# Patient Record
Sex: Male | Born: 1970 | Race: Black or African American | Hispanic: No | Marital: Married | Smoking: Current every day smoker
Health system: Southern US, Community
[De-identification: ages and names within clinical notes are randomized; demographics above are authoritative.]

---

## 2015-12-30 ENCOUNTER — Encounter (HOSPITAL_COMMUNITY): Payer: Self-pay | Admitting: Emergency Medicine

## 2015-12-30 ENCOUNTER — Emergency Department (HOSPITAL_COMMUNITY): Payer: Self-pay

## 2015-12-30 ENCOUNTER — Emergency Department (HOSPITAL_COMMUNITY)
Admission: EM | Admit: 2015-12-30 | Discharge: 2015-12-31 | Disposition: A | Discharge status: Home / Self Care | Payer: Self-pay | Attending: Emergency Medicine | Admitting: Emergency Medicine

## 2015-12-30 DIAGNOSIS — F17200 Nicotine dependence, unspecified, uncomplicated: Secondary | ICD-10-CM | POA: Insufficient documentation

## 2015-12-30 DIAGNOSIS — R0602 Shortness of breath: Secondary | ICD-10-CM | POA: Insufficient documentation

## 2015-12-30 DIAGNOSIS — J069 Acute upper respiratory infection, unspecified: Secondary | ICD-10-CM | POA: Insufficient documentation

## 2015-12-30 LAB — CBC WITH DIFFERENTIAL/PLATELET
BASOS ABS: 0 10*3/uL (ref 0.0–0.1)
Basophils Relative: 0 %
EOS ABS: 0.1 10*3/uL (ref 0.0–0.7)
EOS PCT: 1 %
HCT: 45.7 % (ref 39.0–52.0)
Hemoglobin: 15.9 g/dL (ref 13.0–17.0)
LYMPHS PCT: 37 %
Lymphs Abs: 3.1 10*3/uL (ref 0.7–4.0)
MCH: 29.7 pg (ref 26.0–34.0)
MCHC: 34.8 g/dL (ref 30.0–36.0)
MCV: 85.4 fL (ref 78.0–100.0)
MONO ABS: 0.5 10*3/uL (ref 0.1–1.0)
Monocytes Relative: 6 %
Neutro Abs: 4.8 10*3/uL (ref 1.7–7.7)
Neutrophils Relative %: 56 %
PLATELETS: 206 10*3/uL (ref 150–400)
RBC: 5.35 MIL/uL (ref 4.22–5.81)
RDW: 12.8 % (ref 11.5–15.5)
WBC: 8.5 10*3/uL (ref 4.0–10.5)

## 2015-12-30 LAB — COMPREHENSIVE METABOLIC PANEL
ALT: 29 U/L (ref 17–63)
AST: 23 U/L (ref 15–41)
Albumin: 4.1 g/dL (ref 3.5–5.0)
Alkaline Phosphatase: 95 U/L (ref 38–126)
Anion gap: 9 (ref 5–15)
BUN: 9 mg/dL (ref 6–20)
CHLORIDE: 102 mmol/L (ref 101–111)
CO2: 26 mmol/L (ref 22–32)
Calcium: 9.2 mg/dL (ref 8.9–10.3)
Creatinine, Ser: 1.01 mg/dL (ref 0.61–1.24)
Glucose, Bld: 99 mg/dL (ref 65–99)
POTASSIUM: 3.8 mmol/L (ref 3.5–5.1)
SODIUM: 137 mmol/L (ref 135–145)
Total Bilirubin: 0.6 mg/dL (ref 0.3–1.2)
Total Protein: 6.6 g/dL (ref 6.5–8.1)

## 2015-12-30 LAB — RAPID STREP SCREEN (MED CTR MEBANE ONLY): STREPTOCOCCUS, GROUP A SCREEN (DIRECT): NEGATIVE

## 2015-12-30 MED ORDER — ACETAMINOPHEN 500 MG PO TABS
1000.0000 mg | ORAL_TABLET | Freq: Once | ORAL | Status: AC
  Administered 2015-12-30: 1000 mg via ORAL
  Filled 2015-12-30: qty 2

## 2015-12-30 MED ORDER — IBUPROFEN 800 MG PO TABS
800.0000 mg | ORAL_TABLET | Freq: Once | ORAL | Status: AC
  Administered 2015-12-30: 800 mg via ORAL
  Filled 2015-12-30: qty 1

## 2015-12-30 MED ORDER — DEXAMETHASONE 4 MG PO TABS
10.0000 mg | ORAL_TABLET | Freq: Once | ORAL | Status: AC
  Administered 2015-12-30: 10 mg via ORAL
  Filled 2015-12-30: qty 3

## 2015-12-30 NOTE — ED Triage Notes (Signed)
Pt. reports SOB onset today , denies cough or congestion , no fever or chills.

## 2015-12-30 NOTE — ED Provider Notes (Signed)
[facility]PT Provider Note   CSN: [id number] Arrival date & time: 12/30/15  2116  First Provider Contact:  First MD Initiated Contact with Patient 12/30/15 2319        History   Chief Complaint Chief Complaint  Patient presents with  . Shortness of Breath    HPI Patrick Cameron is a 45 y.o. male.  45 yo M with a chief complaint shortness of breath. Patient feels that he is really congested and has a sore throat. Denies cough fever chest pain. He denies sick contacts. Denies myalgias. No difficulty swallowing.   The history is provided by the patient.  Shortness of Breath  This is a new problem. The average episode lasts 2 days. The problem occurs continuously.The current episode started less than 1 hour ago. The problem has not changed since onset.Associated symptoms include rhinorrhea and sore throat. Pertinent negatives include no fever, no headaches, no chest pain, no vomiting, no abdominal pain and no rash. He has tried nothing for the symptoms. The treatment provided no relief. He has had no prior hospitalizations. He has had no prior ED visits. He has had no prior ICU admissions.    History reviewed. No pertinent past medical history.  There are no active problems to display for this patient.   History reviewed. No pertinent surgical history.     Home Medications    Prior to Admission medications   Not on File    Family History No family history on file.  Social History Social History  Substance Use Topics  . Smoking status: Current Every Day Smoker  . Smokeless tobacco: Not on file  . Alcohol use No     Allergies   Review of patient's allergies indicates no known allergies.   Review of Systems Review of Systems  Constitutional: Negative for chills and fever.  HENT: Positive for congestion, rhinorrhea and sore throat. Negative for facial swelling.   Eyes: Negative for discharge and visual disturbance.  Respiratory: Negative for shortness  of breath.   Cardiovascular: Negative for chest pain and palpitations.  Gastrointestinal: Negative for abdominal pain, diarrhea and vomiting.  Musculoskeletal: Negative for arthralgias and myalgias.  Skin: Negative for color change and rash.  Neurological: Negative for tremors, syncope and headaches.  Psychiatric/Behavioral: Negative for confusion and dysphoric mood.     Physical Exam Updated Vital Signs BP 137/93   Pulse 71   Temp 98.9 F (37.2 C) (Oral)   Resp 19   Ht  (1.626 m)   Wt 162 lb 6.4 oz (73.7 kg)   SpO2 99%   BMI 27.88 kg/m   Physical Exam  Constitutional: He is oriented to person, place, and time. He appears well-developed and well-nourished.  HENT:  Head: Normocephalic and atraumatic.  Swollen turbinates, posterior nasal drip, no noted sinus ttp, tm normal bilaterally.  Mild posterior pharyngeal erythema with some petechia.   Eyes: Conjunctivae and EOM are normal. Pupils are equal, round, and reactive to light.  Neck: Normal range of motion. No JVD present.  Cardiovascular: Normal rate and regular rhythm.   Pulmonary/Chest: Effort normal. No stridor. No respiratory distress.  Abdominal: He exhibits no distension. There is no tenderness. There is no guarding.  Musculoskeletal: Normal range of motion. He exhibits no edema.  Neurological: He is alert and oriented to person, place, and time.  Skin: Skin is warm and dry.  Psychiatric: He has a normal mood and affect. His behavior is normal.     ED Treatments / Results  Labs (all labs ordered are listed, but only abnormal results are displayed) Labs Reviewed  RAPID STREP SCREEN (NOT AT [facility])  CULTURE, GROUP A STREP [facility])  CBC WITH DIFFERENTIAL/PLATELET  COMPREHENSIVE METABOLIC PANEL    EKG  EKG Interpretation  Date/Time:  Wednesday December 30 2015 21:25:05 EDT Ventricular Rate:  83 PR Interval:  128 QRS Duration: 82 QT Interval:  338 QTC Calculation: 397 R Axis:   72 Text Interpretation:   Normal sinus rhythm Normal ECG No old tracing to compare Confirmed by Charmelle Soh MD, DANIEL [id number]) on 12/30/2015 10:25:35 PM       Radiology Dg Chest 2 View  Result Date: 12/30/2015 CLINICAL DATA:  45 year old male with shortness of breath EXAM: CHEST  2 VIEW COMPARISON:  None. FINDINGS: Two views the chest demonstrate minimal left lung base vascular crowding, likely atelectatic changes. There is no focal consolidation, pleural effusion, or pneumothorax. The cardiac silhouette is within normal limits. No acute osseous pathology. IMPRESSION: No active cardiopulmonary disease. Electronically Signed   By: Elgie Collard M.D.   On: 12/30/2015 21:42   Procedures Procedures (including critical care time)  Medications Ordered in ED Medications  dexamethasone (DECADRON) tablet 10 mg (10 mg Oral Given 12/30/15 2302)  acetaminophen (TYLENOL) tablet 1,000 mg (1,000 mg Oral Given 12/30/15 2303)  ibuprofen (ADVIL,MOTRIN) tablet 800 mg (800 mg Oral Given 12/30/15 2302)     Initial Impression / Assessment and Plan / ED Course  I have reviewed the triage vital signs and the nursing notes.  Pertinent labs & imaging results that were available during my care of the patient were reviewed by me and considered in my medical decision making (see chart for details).  Clinical Course    45 yo M With a chief complaint of shortness of breath. This related to his nasal congestion and posterior nasal drip. Patient had some mild erythema with some palatal petechia which was concerning for possible strep pharyngitis. Rapid strep was negative. Given Decadron. PCP follow-up.  11:43 PM:  I have discussed the diagnosis/risks/treatment options with the patient and family and believe the pt to be eligible for discharge home to follow-up with PCP. We also discussed returning to the ED immediately if new or worsening sx occur. We discussed the sx which are most concerning (e.g., sudden worsening pain, fever, inability to  tolerate by mouth) that necessitate immediate return. Medications administered to the patient during their visit and any new prescriptions provided to the patient are listed below.  Medications given during this visit Medications  dexamethasone (DECADRON) tablet 10 mg (10 mg Oral Given 12/30/15 2302)  acetaminophen (TYLENOL) tablet 1,000 mg (1,000 mg Oral Given 12/30/15 2303)  ibuprofen (ADVIL,MOTRIN) tablet 800 mg (800 mg Oral Given 12/30/15 2302)    New Prescriptions   No medications on file    The patient appears reasonably screen and/or stabilized for discharge and I doubt any other medical condition or other [facility] requiring further screening, evaluation, or treatment in the ED at this time prior to discharge.    Final Clinical Impressions(s) / ED Diagnoses   Final diagnoses:  SOB (shortness of breath)  URI (upper respiratory infection)    New Prescriptions New Prescriptions   No medications on file     Melene Plan, DO 12/30/15 2343

## 2015-12-31 NOTE — ED Notes (Signed)
Pt and family understood dc material. NAD noted 

## 2016-01-03 LAB — CULTURE, GROUP A STREP (THRC)

## 2016-01-06 ENCOUNTER — Emergency Department (HOSPITAL_COMMUNITY)
Admission: EM | Admit: 2016-01-06 | Discharge: 2016-01-06 | Disposition: A | Discharge status: Home / Self Care | Payer: Self-pay | Attending: Emergency Medicine | Admitting: Emergency Medicine

## 2016-01-06 ENCOUNTER — Encounter (HOSPITAL_COMMUNITY): Payer: Self-pay | Admitting: Neurology

## 2016-01-06 DIAGNOSIS — K0889 Other specified disorders of teeth and supporting structures: Secondary | ICD-10-CM

## 2016-01-06 DIAGNOSIS — K029 Dental caries, unspecified: Secondary | ICD-10-CM | POA: Insufficient documentation

## 2016-01-06 DIAGNOSIS — F17200 Nicotine dependence, unspecified, uncomplicated: Secondary | ICD-10-CM | POA: Insufficient documentation

## 2016-01-06 MED ORDER — HYDROCODONE-ACETAMINOPHEN 5-325 MG PO TABS: 2.0000 | ORAL_TABLET | ORAL | 0 refills | Status: AC | PRN

## 2016-01-06 MED ORDER — PENICILLIN V POTASSIUM 500 MG PO TABS
500.0000 mg | ORAL_TABLET | Freq: Four times a day (QID) | ORAL | 0 refills | Status: AC
Start: 2016-01-06 — End: 2016-01-13

## 2016-01-06 NOTE — Discharge Instructions (Signed)
Medications: Penicillin, Norco  Treatment: Take penicillin 4 times daily for 7 days. Make sure to finish all of your antibiotics. Take Norco every 4 hours as needed for severe pain. Take ibuprofen for mild to moderate pain. Do not take Tylenol while taking Norco. Do not drive or every machinery while taking Norco. Warm salt water gargles 3-4 times daily to soothe your tooth and your pain.  Follow-up: Please see a dentist as soon as possible by calling the number below or any of the dentists on the attachment. Please return to emergency department if you develop any new or worsening symptoms such as fever, increasing pain, swelling to the area.  [facility]  [employer] [facility]  [address]  [place], [place] [address]  Phone [phone]

## 2016-01-06 NOTE — ED Triage Notes (Signed)
Pt reports right lower dental pain since last night, thinks he may have an abscess. Denies fever. Is a x 4. In NAD

## 2016-01-06 NOTE — ED Provider Notes (Signed)
[facility] Provider Note  By signing my name below, I, Mesha Guinyard, attest that this documentation has been prepared under the direction and in the presence of Treatment Team:  Attending Provider: Gwyneth Sprout, MD Physician Assistant: Emi Holes, PA-C.  Electronically Signed: Arvilla Market, Medical Scribe. 01/06/16. 9:19 AM.  CSN: [id number] Arrival date & time: 01/06/16  8295  First Provider Contact:  None   History   Chief Complaint Chief Complaint  Patient presents with  . Dental Pain    HPI Comments: Patrick Cameron is a 45 y.o. male who presents to the Emergency Department complaining of right top and bottom dental pain onset last night. Although pt does not currently have dental pain, last night, his pain was rated "100/10" and caused nausea. Pt never had problems with that side since his tooth has been cracked for 10 years, which is the last time he's seen a dentist. Pt states the filling came out 10 years ago. He states that he is having associated symptoms of right sided neck pain, and nausea with the pain. He states that he has tried excedrin and ibuprofen with no relief for his symptoms. Pt does not have a dentist. He denies drainage from the site, abdominal pain, chest pain, sore throat, fever, chills, and HAs.  The history is provided by the patient. No language interpreter was used.  Dental Pain   This is a new problem. The current episode started 12 to 24 hours ago.    History reviewed. No pertinent past medical history.  There are no active problems to display for this patient.   History reviewed. No pertinent surgical history.     Home Medications    Prior to Admission medications   Medication Sig Start Date End Date Taking? Authorizing Provider  HYDROcodone-acetaminophen (NORCO/VICODIN) 5-325 MG tablet Take 2 tablets by mouth every 4 (four) hours as needed. 01/06/16   Emi Holes, PA-C  penicillin v potassium (VEETID) 500 MG tablet  Take 1 tablet (500 mg total) by mouth 4 (four) times daily. 01/06/16 01/13/16  Emi Holes, PA-C    Family History No family history on file.  Social History Social History  Substance Use Topics  . Smoking status: Current Every Day Smoker  . Smokeless tobacco: Never Used  . Alcohol use No     Allergies   Review of patient's allergies indicates no known allergies.   Review of Systems Review of Systems  Constitutional: Negative for chills and fever.  HENT: Positive for dental problem. Negative for facial swelling and sore throat.   Respiratory: Negative for shortness of breath.   Cardiovascular: Negative for chest pain.  Gastrointestinal: Positive for nausea. Negative for abdominal pain and vomiting.  Genitourinary: Negative for dysuria.  Musculoskeletal: Positive for neck pain. Negative for back pain.  Skin: Negative for rash and wound.  Neurological: Negative for headaches.  Psychiatric/Behavioral: The patient is not nervous/anxious.    Physical Exam Updated Vital Signs BP 129/90 (BP Location: Left Arm)   Pulse 73   Temp 98.3 F (36.8 C) (Oral)   Resp 14   SpO2 98%   Physical Exam  Constitutional: He appears well-developed and well-nourished. No distress.  HENT:  Head: Normocephalic and atraumatic.  Mouth/Throat: Oropharynx is clear and moist. Dental caries present. No oropharyngeal exudate.    Right lower jaw edema; no abscess noted; no abnormality or tenderness to superior R teeth  Eyes: Conjunctivae are normal. Pupils are equal, round, and reactive to light. Right eye exhibits  no discharge. Left eye exhibits no discharge. No scleral icterus.  Neck: Normal range of motion. Neck supple. No thyromegaly present.  No masses or tenderness palpated  Cardiovascular: Normal rate, regular rhythm and normal heart sounds.  Exam reveals no gallop and no friction rub.   No murmur heard. Pulmonary/Chest: Effort normal and breath sounds normal. No stridor. No respiratory  distress. He has no wheezes. He has no rales.  Abdominal: Soft. Bowel sounds are normal. He exhibits no distension. There is no tenderness. There is no rebound and no guarding.  Musculoskeletal: He exhibits no edema.  Lymphadenopathy:    He has no cervical adenopathy.  Neurological: He is alert. Coordination normal.  Skin: Skin is warm and dry. No rash noted. He is not diaphoretic. No pallor.  Psychiatric: He has a normal mood and affect.  Nursing note and vitals reviewed.    ED Treatments / Results  Labs (all labs ordered are listed, but only abnormal results are displayed) Labs Reviewed - No data to display  DIAGNOSTIC STUDIES: Oxygen Saturation is 98% on RA, nl by my interpretation.    COORDINATION OF CARE: 9:41 AM Discussed treatment plan with pt at bedside and pt agreed to plan.   EKG  EKG Interpretation None       Radiology No results found.  Procedures Procedures (including critical care time)  Medications Ordered in ED Medications - No data to display   Initial Impression / Assessment and Plan / ED Course  I have reviewed the triage vital signs and the nursing notes.  Pertinent labs & imaging results that were available during my care of the patient were reviewed by me and considered in my medical decision making (see chart for details).  Clinical Course      Final Clinical Impressions(s) / ED Diagnoses   Final diagnoses:  Pain, dental   Patient with dentalgia.  No abscess requiring immediate incision and drainage.  Exam not concerning for Ludwig's angina or pharyngeal abscess.  Will treat with penicillin, Norco.Patient advised not to drive or operate machinery while taking Norco and only take as prescribed. Pt instructed to follow-up with dentist. Patient given dental resources.  Discussed return precautions. Patient afebrile, vitals stable and discharged in satisfactory condition.   New Prescriptions New Prescriptions   HYDROCODONE-ACETAMINOPHEN  (NORCO/VICODIN) 5-325 MG TABLET    Take 2 tablets by mouth every 4 (four) hours as needed.   PENICILLIN V POTASSIUM (VEETID) 500 MG TABLET    Take 1 tablet (500 mg total) by mouth 4 (four) times daily.    I personally performed the services described in this documentation, which was scribed in my presence. The recorded information has been reviewed and is accurate.    [address], PA-C 01/06/16 9532    Gwyneth Sprout, MD 01/07/16 [phone]

## 2017-08-26 IMAGING — DX DG CHEST 2V
2 series · 2 of 2 positions shown · non-contrast
Comparison: None.

CLINICAL DATA: 45-year-old male with shortness of breath

EXAM:
CHEST  2 VIEW

[chest pa]
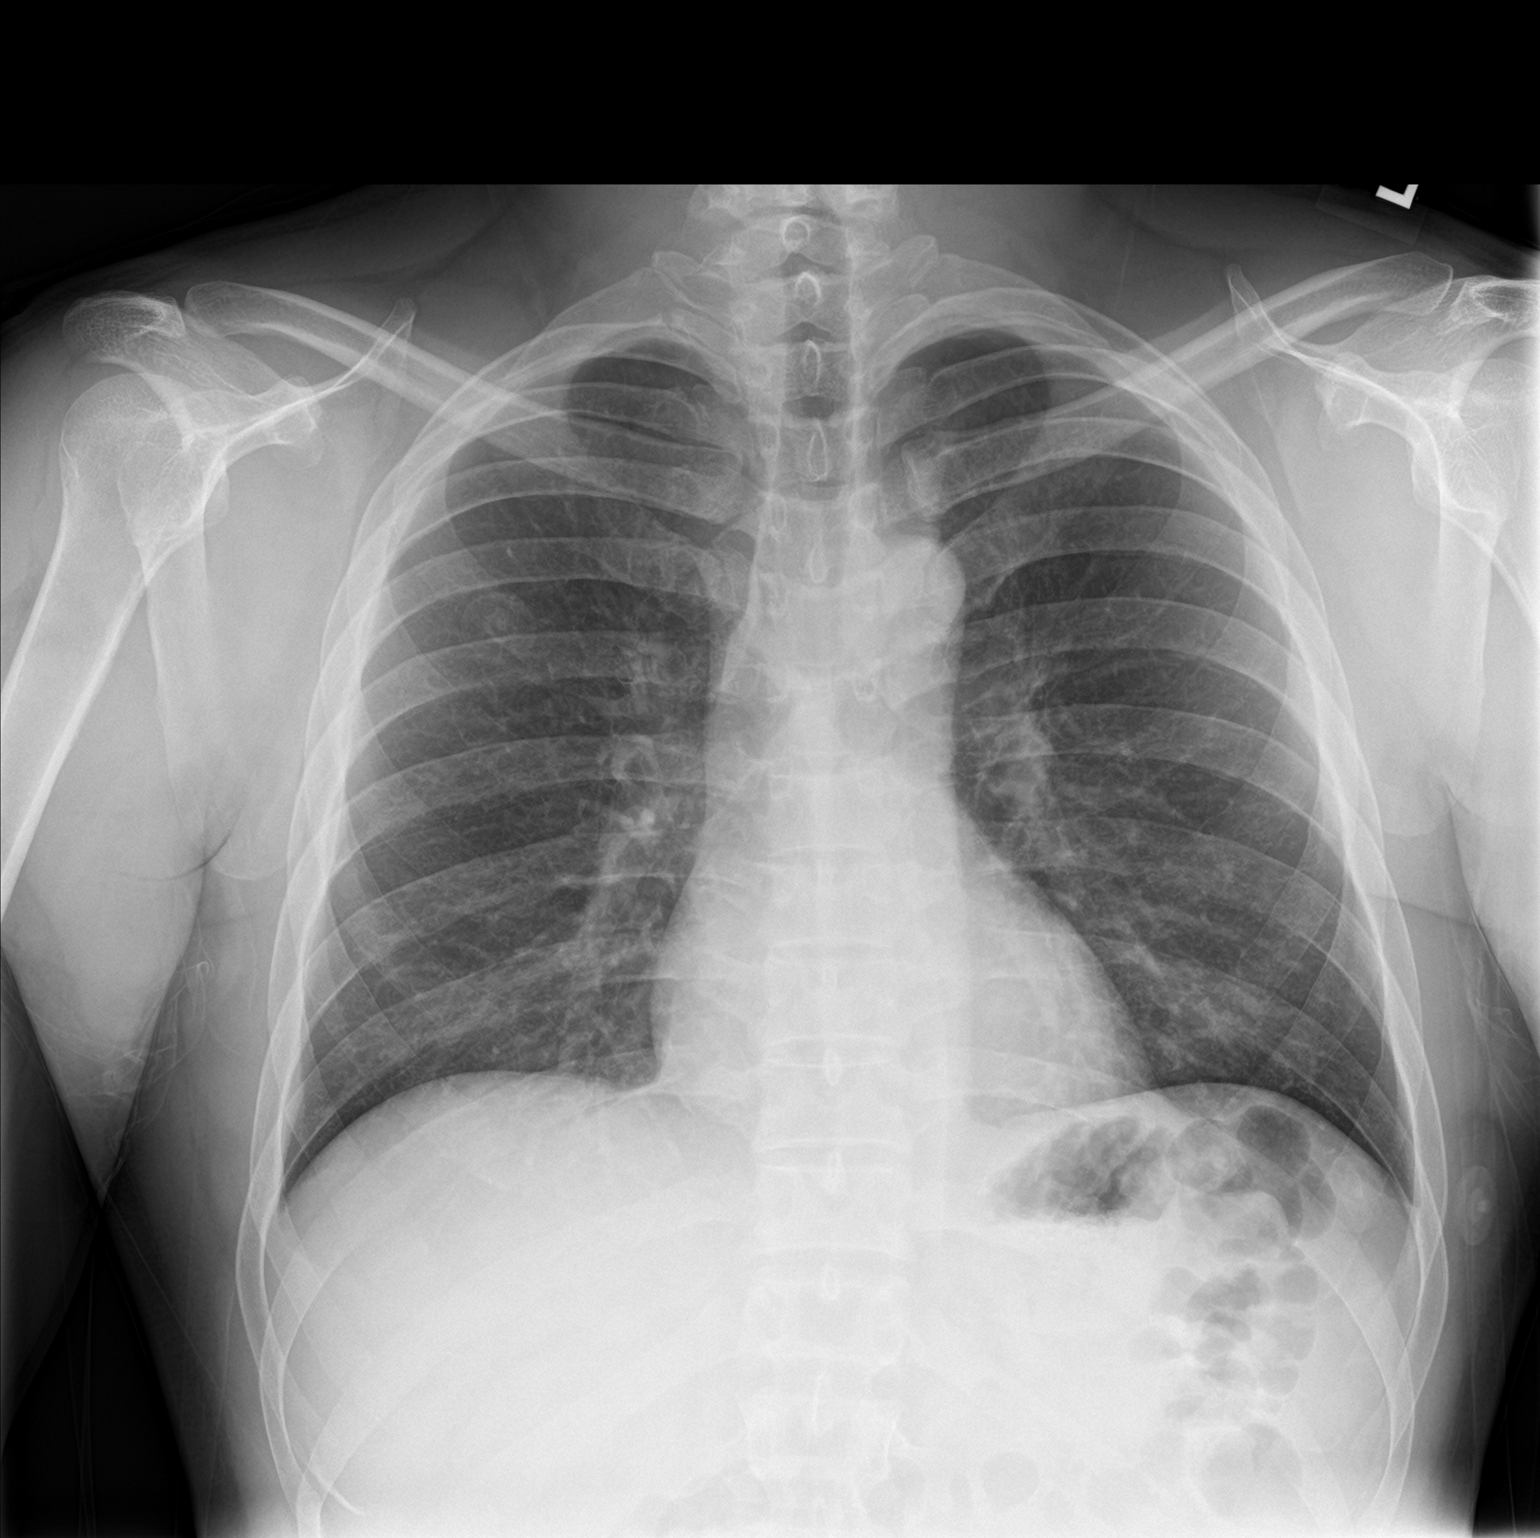

[chest lat]
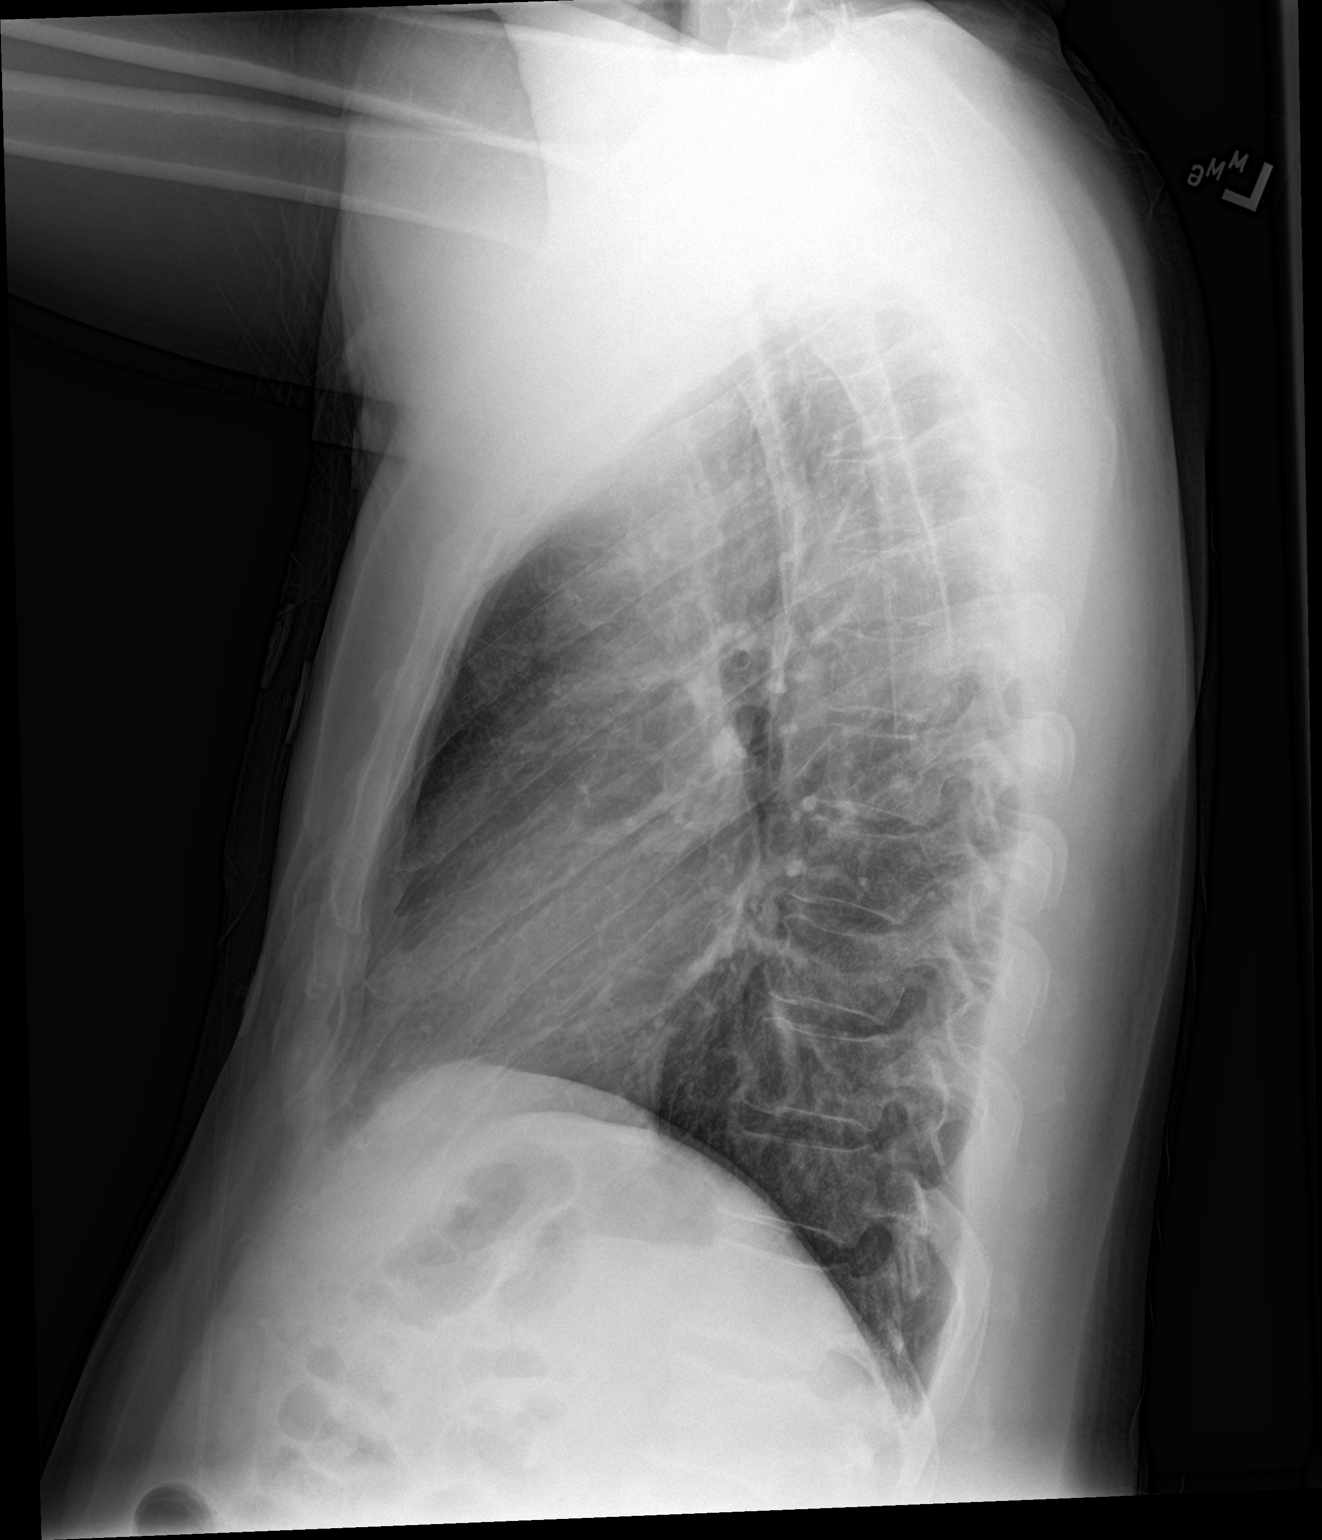

[2 of 2 positions shown; findings below may reference images not displayed]

FINDINGS: Two views the chest demonstrate minimal left lung base vascular
crowding, likely atelectatic changes. There is no focal
consolidation, pleural effusion, or pneumothorax. The cardiac
silhouette is within normal limits. No acute osseous pathology.
IMPRESSION: No active cardiopulmonary disease.

## 2021-04-12 ENCOUNTER — Emergency Department (HOSPITAL_COMMUNITY): Admission: EM | Admit: 2021-04-12 | Discharge: 2021-04-13 | Discharge status: Against Medical Advice | Payer: 59

## 2021-04-12 NOTE — ED Notes (Addendum)
Called for triage x3
